# Patient Record
Sex: Female | Born: 1955 | Race: White | Hispanic: No | Smoking: Never smoker
Health system: Southern US, Community
[De-identification: ages and names within clinical notes are randomized; demographics above are authoritative.]

## PROBLEM LIST (undated history)

## (undated) DIAGNOSIS — R011 Cardiac murmur, unspecified: Secondary | ICD-10-CM

## (undated) DIAGNOSIS — G5600 Carpal tunnel syndrome, unspecified upper limb: Secondary | ICD-10-CM

## (undated) DIAGNOSIS — Z96649 Presence of unspecified artificial hip joint: Secondary | ICD-10-CM

## (undated) HISTORY — DX: Presence of unspecified artificial hip joint: Z96.649

## (undated) HISTORY — DX: Carpal tunnel syndrome, unspecified upper limb: G56.00

## (undated) HISTORY — DX: Cardiac murmur, unspecified: R01.1

## (undated) HISTORY — PX: BLADDER EXTROPHY RECONSTRUCTION PELVIC SAGITTAL OSTEOTOMY: SHX1236

---

## 2012-07-09 LAB — HM MAMMOGRAPHY: HM MAMMO: NORMAL

## 2012-08-10 LAB — HM COLONOSCOPY: HM Colonoscopy: NORMAL

## 2013-07-09 ENCOUNTER — Ambulatory Visit (INDEPENDENT_AMBULATORY_CARE_PROVIDER_SITE_OTHER): Payer: Medicare Other | Admitting: Family Medicine

## 2013-07-09 ENCOUNTER — Encounter: Payer: Self-pay | Admitting: Family Medicine

## 2013-07-09 VITALS — BP 110/80 | HR 82 | Temp 98.3°F | Resp 18 | Ht 63.5 in | Wt 215.0 lb

## 2013-07-09 DIAGNOSIS — J019 Acute sinusitis, unspecified: Secondary | ICD-10-CM

## 2013-07-09 DIAGNOSIS — R222 Localized swelling, mass and lump, trunk: Secondary | ICD-10-CM

## 2013-07-09 DIAGNOSIS — L720 Epidermal cyst: Secondary | ICD-10-CM | POA: Insufficient documentation

## 2013-07-09 DIAGNOSIS — H1013 Acute atopic conjunctivitis, bilateral: Secondary | ICD-10-CM

## 2013-07-09 DIAGNOSIS — L723 Sebaceous cyst: Secondary | ICD-10-CM

## 2013-07-09 DIAGNOSIS — H1010 Acute atopic conjunctivitis, unspecified eye: Secondary | ICD-10-CM | POA: Insufficient documentation

## 2013-07-09 DIAGNOSIS — H1045 Other chronic allergic conjunctivitis: Secondary | ICD-10-CM

## 2013-07-09 MED ORDER — SULFAMETHOXAZOLE-TMP DS 800-160 MG PO TABS: 1.0000 | ORAL_TABLET | Freq: Two times a day (BID) | ORAL | Status: DC

## 2013-07-09 NOTE — Patient Instructions (Addendum)
Ultrasound of chest to be done Eye drop- for allergies over the counter- [place] and LAMB Nasocort over the counter  Take antibiotics for the sinusitis F/U as needed

## 2013-07-09 NOTE — Assessment & Plan Note (Signed)
Treat based on worsening symptoms, no signs of cellulitis of right eye, bactrim given Will use Nasocort OTC  Unfortunately she has no prescription drug coverage

## 2013-07-09 NOTE — Progress Notes (Signed)
  Subjective:    Patient ID: Margaret Jimenez, female    DOB: [birth year], 57 y.o.   MRN: [id number]  HPI  Patient here with sinusitis and pressure around both eyes. She's had some drainage from the nose over the past week as well as a lot of pressure mostly on the right side of her face above and below her eye. She's not had any change in her vision. She has noticed some itching and redness of both eyes with some clear crusting in the morning the past couple of weeks. She's not had any fever has had some sore throat and mild cough associated.  She also complains of a couple of cysts that she will like me to evaluate as well as a mass on her chest wall which she noticed a couple weeks ago. She's had a normal mammogram in 2013.   Review of Systems   GEN- denies fatigue, fever, weight loss,weakness, recent illness HEENT- denies eye drainage, change in vision, +nasal discharge, CVS- denies chest pain, palpitations RESP- denies SOB, cough, wheeze Neuro- denies headache, dizziness, syncope, seizure activity      Objective:   Physical Exam GEN- NAD, alert and oriented x3 HEENT- PERRL, EOMI, non injected sclera, injected bulbar conjunctiva, MMM, oropharynx clear, TM clear bilat no effusion, + maxillary and right frontal sinus tenderness,+  Nasal drainage ,vision grossly in tact Breast- normal symmetry, no nipple inversion,no nipple drainage, no nodules or lumps felt Nodes- no axillary nodes Chest wall- 2-3 cm to right sternum- small nickle size mass palpated, mild TTP, no fluctance Neck- Supple, no LAD CVS- RRR, no murmur RESP-CTAB EXT- No edema Pulses- Radial 2+ Skin- lipoma Right lower back, epidermal cyst Left lower back, Upper back- central- small erythematous scab, no cystic lesion felt       Assessment & Plan:

## 2013-07-09 NOTE — Assessment & Plan Note (Signed)
I think her eyes are more allergic, will have her use OTC allergic eye drops

## 2013-07-09 NOTE — Assessment & Plan Note (Signed)
Ultrasound to be done, this is not in locaton of breast tissue

## 2013-07-10 NOTE — Addendum Note (Signed)
Addended by: Milinda Antis F on: 07/10/2013 01:39 PM   Modules accepted: Orders

## 2013-07-12 ENCOUNTER — Other Ambulatory Visit: Payer: Self-pay | Admitting: Family Medicine

## 2013-07-12 ENCOUNTER — Ambulatory Visit (HOSPITAL_COMMUNITY)
Admission: RE | Admit: 2013-07-12 | Discharge: 2013-07-12 | Disposition: A | Discharge status: Home / Self Care | Payer: Medicare Other | Source: Ambulatory Visit | Attending: Family Medicine | Admitting: Family Medicine

## 2013-07-12 DIAGNOSIS — R222 Localized swelling, mass and lump, trunk: Secondary | ICD-10-CM

## 2013-12-26 ENCOUNTER — Emergency Department (HOSPITAL_COMMUNITY)
Admission: EM | Admit: 2013-12-26 | Discharge: 2013-12-26 | Disposition: A | Discharge status: Home / Self Care | Payer: Medicare Other | Attending: Emergency Medicine | Admitting: Emergency Medicine

## 2013-12-26 ENCOUNTER — Encounter (HOSPITAL_COMMUNITY): Payer: Self-pay | Admitting: Emergency Medicine

## 2013-12-26 ENCOUNTER — Emergency Department (HOSPITAL_COMMUNITY): Payer: Medicare Other

## 2013-12-26 DIAGNOSIS — R011 Cardiac murmur, unspecified: Secondary | ICD-10-CM | POA: Insufficient documentation

## 2013-12-26 DIAGNOSIS — M25559 Pain in unspecified hip: Secondary | ICD-10-CM | POA: Insufficient documentation

## 2013-12-26 DIAGNOSIS — Z8669 Personal history of other diseases of the nervous system and sense organs: Secondary | ICD-10-CM | POA: Insufficient documentation

## 2013-12-26 DIAGNOSIS — Z880 Allergy status to penicillin: Secondary | ICD-10-CM | POA: Insufficient documentation

## 2013-12-26 DIAGNOSIS — M25551 Pain in right hip: Secondary | ICD-10-CM

## 2013-12-26 DIAGNOSIS — Z7982 Long term (current) use of aspirin: Secondary | ICD-10-CM | POA: Insufficient documentation

## 2013-12-26 DIAGNOSIS — Z96649 Presence of unspecified artificial hip joint: Secondary | ICD-10-CM | POA: Insufficient documentation

## 2013-12-26 DIAGNOSIS — Z9889 Other specified postprocedural states: Secondary | ICD-10-CM | POA: Insufficient documentation

## 2013-12-26 LAB — BASIC METABOLIC PANEL
BUN: 25 mg/dL — ABNORMAL HIGH (ref 6–23)
CO2: 25 meq/L (ref 19–32)
Calcium: 9.7 mg/dL (ref 8.4–10.5)
Chloride: 103 mEq/L (ref 96–112)
Creatinine, Ser: 0.63 mg/dL (ref 0.50–1.10)
GFR calc Af Amer: >90 mL/min (ref >90)
GFR calc non Af Amer: >90 mL/min (ref >90)
Glucose, Bld: 158 mg/dL — ABNORMAL HIGH (ref 70–99)
Potassium: 3.6 mEq/L — ABNORMAL LOW (ref 3.7–5.3)
SODIUM: 142 meq/L (ref 137–147)

## 2013-12-26 LAB — CBC
HCT: 37.3 % (ref 36.0–46.0)
HEMOGLOBIN: 12.5 g/dL (ref 12.0–15.0)
MCH: 31.7 pg (ref 26.0–34.0)
MCHC: 33.5 g/dL (ref 30.0–36.0)
MCV: 94.7 fL (ref 78.0–100.0)
Platelets: 237 10*3/uL (ref 150–400)
RBC: 3.94 MIL/uL (ref 3.87–5.11)
RDW: 12.5 % (ref 11.5–15.5)
WBC: 10.1 10*3/uL (ref 4.0–10.5)

## 2013-12-26 MED ORDER — OXYCODONE-ACETAMINOPHEN 5-325 MG PO TABS: 2.0000 | ORAL_TABLET | ORAL | Status: DC | PRN

## 2013-12-26 MED ORDER — ONDANSETRON HCL 4 MG/2ML IJ SOLN
4.0000 mg | Freq: Once | INTRAMUSCULAR | Status: AC
  Administered 2013-12-26: 4 mg via INTRAVENOUS
  Filled 2013-12-26: qty 2

## 2013-12-26 MED ORDER — HYDROMORPHONE HCL PF 1 MG/ML IJ SOLN
1.0000 mg | Freq: Once | INTRAMUSCULAR | Status: AC
  Administered 2013-12-26: 1 mg via INTRAVENOUS
  Filled 2013-12-26: qty 1

## 2013-12-26 NOTE — ED Notes (Addendum)
Patient transported to x-ray. ?

## 2013-12-26 NOTE — ED Notes (Signed)
Pt ambulated in the hallway with standby assistance.  Pt tolerated bearing weight to the right leg with pain noted, MD Dierdre Highmanpitz updated.

## 2013-12-26 NOTE — ED Notes (Signed)
Waiting for results of second CT scan, Dr. Criss AlvineGoldston to assess. Patient updated with plan of care.

## 2013-12-26 NOTE — Discharge Instructions (Signed)
Arthralgia  Your caregiver has diagnosed you as suffering from an arthralgia. Arthralgia means there is pain in a joint. This can come from many reasons including:  · Bruising the joint which causes soreness (inflammation) in the joint.  · Wear and tear on the joints which occur as we grow older (osteoarthritis).  · Overusing the joint.  · Various forms of arthritis.  · Infections of the joint.  Regardless of the cause of pain in your joint, most of these different pains respond to anti-inflammatory drugs and rest. The exception to this is when a joint is infected, and these cases are treated with antibiotics, if it is a bacterial infection.  HOME CARE INSTRUCTIONS   · Rest the injured area for as long as directed by your caregiver. Then slowly start using the joint as directed by your caregiver and as the pain allows. Crutches as directed may be useful if the ankles, knees or hips are involved. If the knee was splinted or casted, continue use and care as directed. If an stretchy or elastic wrapping bandage has been applied today, it should be removed and re-applied every 3 to 4 hours. It should not be applied tightly, but firmly enough to keep swelling down. Watch toes and feet for swelling, bluish discoloration, coldness, numbness or excessive pain. If any of these problems (symptoms) occur, remove the ace bandage and re-apply more loosely. If these symptoms persist, contact your caregiver or return to this location.  · For the first 24 hours, keep the injured extremity elevated on pillows while lying down.  · Apply ice for 15-20 minutes to the sore joint every couple hours while awake for the first half day. Then 03-04 times per day for the first 48 hours. Put the ice in a plastic bag and place a towel between the bag of ice and your skin.  · Wear any splinting, casting, elastic bandage applications, or slings as instructed.  · Only take over-the-counter or prescription medicines for pain, discomfort, or fever as  directed by your caregiver. Do not use aspirin immediately after the injury unless instructed by your physician. Aspirin can cause increased bleeding and bruising of the tissues.  · If you were given crutches, continue to use them as instructed and do not resume weight bearing on the sore joint until instructed.  Persistent pain and inability to use the sore joint as directed for more than 2 to 3 days are warning signs indicating that you should see a caregiver for a follow-up visit as soon as possible. Initially, a hairline fracture (break in bone) may not be evident on X-rays. Persistent pain and swelling indicate that further evaluation, non-weight bearing or use of the joint (use of crutches or slings as instructed), or further X-rays are indicated. X-rays may sometimes not show a small fracture until a week or 10 days later. Make a follow-up appointment with your own caregiver or one to whom we have referred you. A radiologist (specialist in reading X-rays) may read your X-rays. Make sure you know how you are to obtain your X-ray results. Do not assume everything is normal if you do not hear from us.  SEEK MEDICAL CARE IF:  Bruising, swelling, or pain increases.  SEEK IMMEDIATE MEDICAL CARE IF:   · Your fingers or toes are numb or blue.  · The pain is not responding to medications and continues to stay the same or get worse.  · The pain in your joint becomes severe.  · You   develop a fever over 102° F (38.9° C).  · It becomes impossible to move or use the joint.  MAKE SURE YOU:   · Understand these instructions.  · Will watch your condition.  · Will get help right away if you are not doing well or get worse.  Document Released: 08/08/2005 Document Revised: 10/31/2011 Document Reviewed: 03/26/2008  [employer]® Patient Information ©2014 [employer].

## 2013-12-26 NOTE — ED Notes (Signed)
Per EMS: pt has had a hip injury and bent over this evening and felt a pain in her hip. Pt was ambulatory out the door for EMS but couldn't get down her stairs. Pt calling out in pain currently.

## 2013-12-26 NOTE — ED Notes (Signed)
Pt placed on 2L Nasal cannula, O2 saturation around 89%, now at 95%.

## 2013-12-26 NOTE — ED Provider Notes (Signed)
CSN: [id number]     Arrival date & time 12/26/13  0319 History   First MD Initiated Contact with Patient 12/26/13 0327     Chief Complaint  Patient presents with  . Hip Pain     (Consider location/radiation/quality/duration/timing/severity/associated sxs/prior Treatment) HPI History per patient. Right hip [place] 2005 in [place]. Patient uses a walker occasionally. She denies any significant issues with her hip otherwise. Today woke up, moved her leg and developed severe right hip pain. No fall or trauma. Able to ambulate on it. Pain is sharp and severe radiates to pelvis and the right thigh. No associated weakness or numbness. No fevers or chills. No history of same. No known alleviating factors. Past Medical History  Diagnosis Date  . Heart murmur     artificial heart  . Presence of artificial hip     right side  . Carpal tunnel syndrome    Past Surgical History  Procedure Laterality Date  . Bladder extrophy reconstruction pelvic sagittal osteotomy     Family History  Problem Relation Age of Onset  . Cancer Father     lymphoma   History  Substance Use Topics  . Smoking status: Not on file  . Smokeless tobacco: Not on file  . Alcohol Use: Not on file   OB History   Grav Para Term Preterm Abortions TAB SAB Ect Mult Living                 Review of Systems  Constitutional: Negative for fever and chills.  Respiratory: Negative for shortness of breath.   Cardiovascular: Negative for chest pain.  Gastrointestinal: Negative for vomiting and abdominal pain.  Genitourinary: Negative for flank pain.  Musculoskeletal: Negative for neck pain.  Skin: Negative for rash.  Neurological: Negative for weakness and numbness.  All other systems reviewed and are negative.     Allergies  Celexa and Penicillins  Home Medications   Prior to Admission medications   Medication Sig Start Date End Date Taking? Authorizing Provider  aspirin EC 325 MG tablet Take 325 mg by  mouth every 6 (six) hours as needed for moderate pain.   Yes Historical Provider, MD   BP 131/57  Pulse 63  Temp(Src) 98.5 F (36.9 C) (Oral)  Resp 18  SpO2 100% Physical Exam  Constitutional: She is oriented to person, place, and time. She appears well-developed and well-nourished.  HENT:  Head: Normocephalic and atraumatic.  Eyes: EOM are normal. Pupils are equal, round, and reactive to light.  Neck: Neck supple.  Cardiovascular: Normal rate, regular rhythm and intact distal pulses.   Pulmonary/Chest: Effort normal and breath sounds normal. No respiratory distress.  Abdominal: Soft. Bowel sounds are normal. She exhibits no distension. There is no tenderness.  Musculoskeletal: She exhibits no edema.  Pelvis stable. Localizes discomfort to right hip without reproducible tenderness. Patient moving in the bed and moving her right lower extremity. There is no deformity or shortening. Distal neurovascular intact. No tenderness of the lumbar spine, thoracic spine. No areas of swelling or erythema appreciated.  Neurological: She is alert and oriented to person, place, and time.  Skin: Skin is warm and dry. No rash noted.    ED Course  Procedures (including critical care time) Labs Review Labs Reviewed  BASIC METABOLIC PANEL - Abnormal; Notable for the following:    Potassium 3.6 (*)    Glucose, Bld 158 (*)    BUN 25 (*)    All other components within normal limits  CBC  Imaging Review Dg Hip Complete Right  12/26/2013   CLINICAL DATA:  Progressively worsening right hip pain. No known injury.  EXAM: RIGHT HIP - COMPLETE 2+ VIEW  COMPARISON:  None.  FINDINGS: Large bony defect in the right acetabulum which could be postsurgical, post infectious, or posttraumatic. There is a total right hip arthroplasty with plate and screw fixation to the right iliac wing and rib obturator ring. The femoral prosthesis is long-stem. No periprosthetic fracture is seen. There are fractured cerclage wires  and additional metallic debris around the prosthetic hip joint. Lucency noted around both the iliac and obturator portions of the acetabular component. Negative left hip.  IMPRESSION: Total right hip arthroplasty as above. 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   Electronically Signed   By: Tiburcio PeaJonathan  Watts M.D.   On: 12/26/2013 04:46   IV Dilaudid provided.  X-ray reviewed and patient still having some pain. Discussed with radiologist who recommends CT scan or MRI to evaluate for possible occult fracture. DR Criss AlvineGoldston to follow up CT results, if neg will d/c home Rx percocet and Ortho referral.   MDM   Diagnosis: Right hip pain  Prior hip replacement. No trauma with pain onset this am. No F/C or clinical septic joint, she is able to move it. Pain improved with IV Dilaudid and able to bear weight. CT ordered to further evaluate.    Sunnie NielsenBrian Stasha Naraine, MD 12/26/13 [phone]

## 2014-01-27 ENCOUNTER — Encounter: Payer: Self-pay | Admitting: Family Medicine

## 2015-01-29 ENCOUNTER — Encounter: Payer: Self-pay | Admitting: Family Medicine

## 2015-02-25 LAB — HM MAMMOGRAPHY: HM Mammogram: NORMAL

## 2015-02-26 ENCOUNTER — Encounter: Payer: Self-pay | Admitting: Family Medicine

## 2015-05-12 ENCOUNTER — Encounter: Payer: Self-pay | Admitting: Family Medicine

## 2016-03-23 ENCOUNTER — Encounter: Payer: Self-pay | Admitting: Family Medicine

## 2016-10-14 LAB — HM MAMMOGRAPHY

## 2016-10-24 ENCOUNTER — Encounter: Payer: Self-pay | Admitting: Family Medicine

## 2018-03-23 ENCOUNTER — Other Ambulatory Visit: Payer: Self-pay | Admitting: Orthopedic Surgery

## 2018-03-23 DIAGNOSIS — M545 Low back pain: Secondary | ICD-10-CM

## 2018-03-25 ENCOUNTER — Other Ambulatory Visit: Payer: Medicare Other

## 2018-03-28 ENCOUNTER — Ambulatory Visit
Admission: RE | Admit: 2018-03-28 | Discharge: 2018-03-28 | Disposition: A | Discharge status: Home / Self Care | Payer: Medicare HMO | Source: Ambulatory Visit | Attending: Orthopedic Surgery | Admitting: Orthopedic Surgery

## 2018-03-28 DIAGNOSIS — M545 Low back pain: Secondary | ICD-10-CM

## 2019-01-11 ENCOUNTER — Other Ambulatory Visit: Payer: Self-pay | Admitting: Family Medicine

## 2019-01-11 DIAGNOSIS — R222 Localized swelling, mass and lump, trunk: Secondary | ICD-10-CM

## 2019-01-15 ENCOUNTER — Ambulatory Visit
Admission: RE | Admit: 2019-01-15 | Discharge: 2019-01-15 | Disposition: A | Discharge status: Home / Self Care | Payer: Medicare HMO | Source: Ambulatory Visit | Attending: Family Medicine | Admitting: Family Medicine

## 2019-01-15 ENCOUNTER — Other Ambulatory Visit: Payer: Self-pay

## 2019-01-15 DIAGNOSIS — R222 Localized swelling, mass and lump, trunk: Secondary | ICD-10-CM

## 2020-10-18 IMAGING — US US SOFT TISSUE EXCLUDE HEAD/NECK
1 series · 12 of 12 positions shown · non-contrast
Comparison: None.

CLINICAL DATA: Left chest mass.  Rule out lipoma or sebaceous cyst.

EXAM:
ULTRASOUND OF CHEST SOFT TISSUES
TECHNIQUE: Ultrasound examination of the chest wall soft tissues was performed
in the area of clinical concern.

[Series 1: us soft tissue exclude head/neck · 0.06mm/px · 12 of 12 slices shown]
[im 1/12]
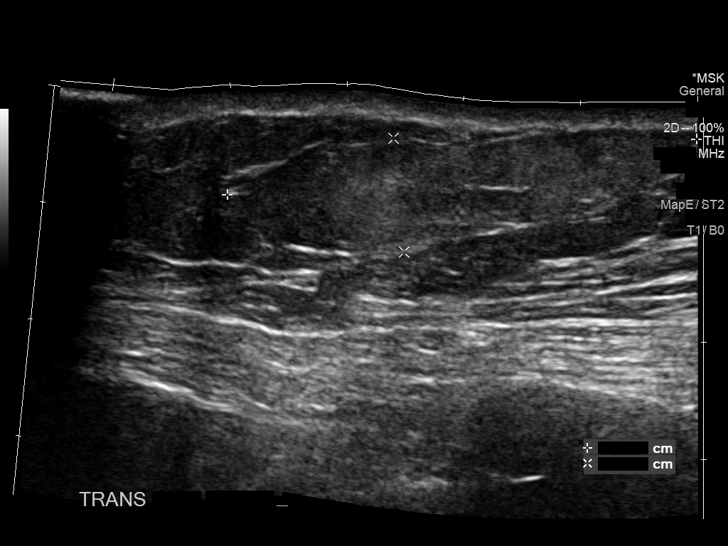
[im 2/12]
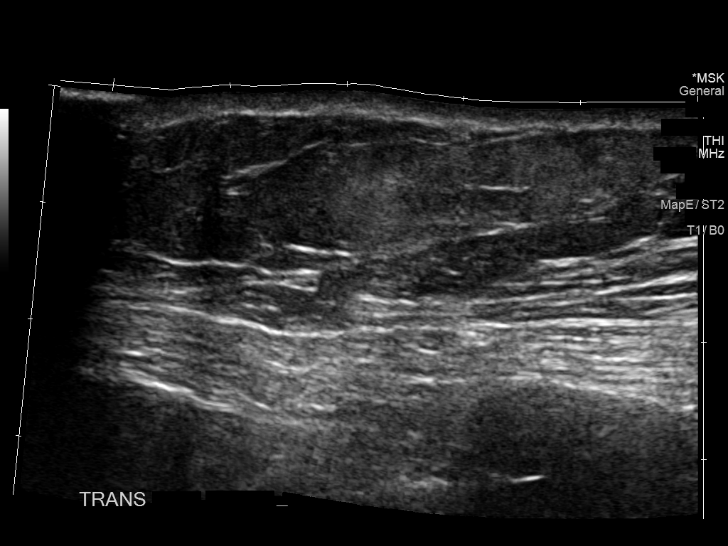
[im 3/12]
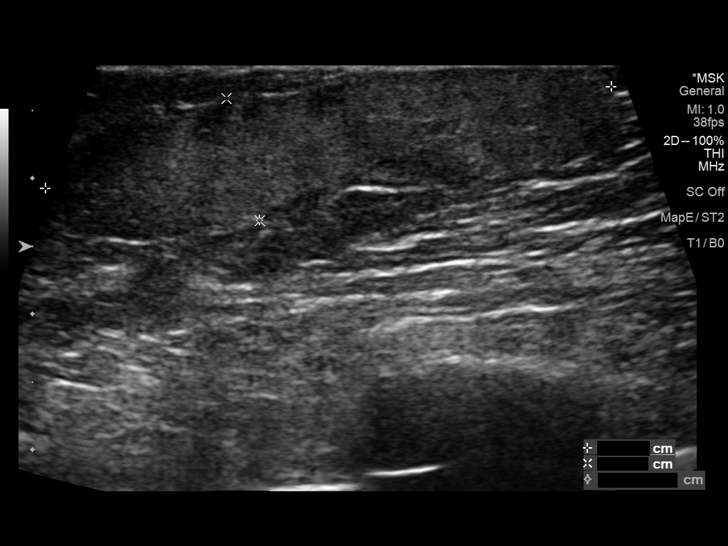
[im 4/12]
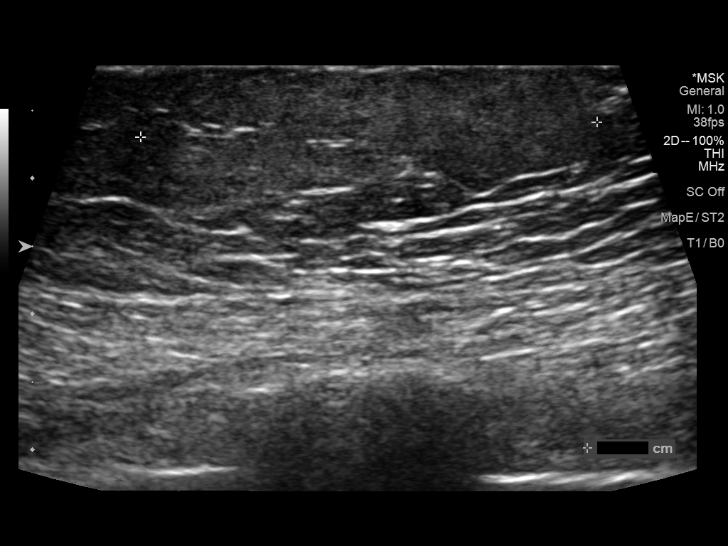
[im 5/12]
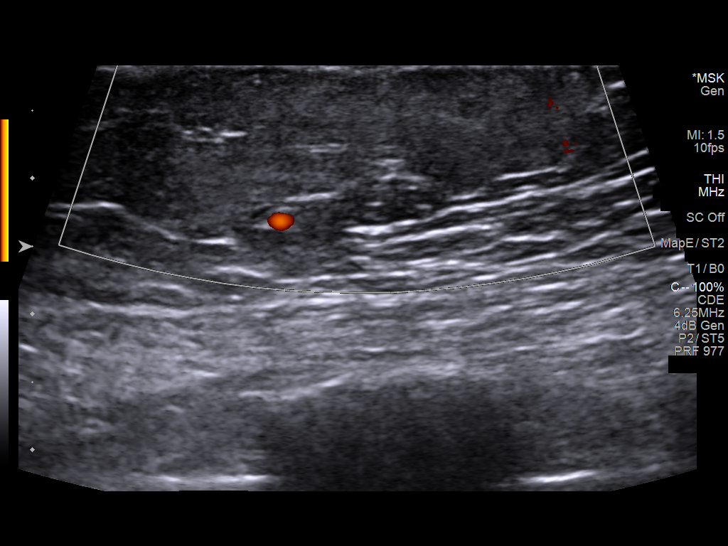
[im 6/12]
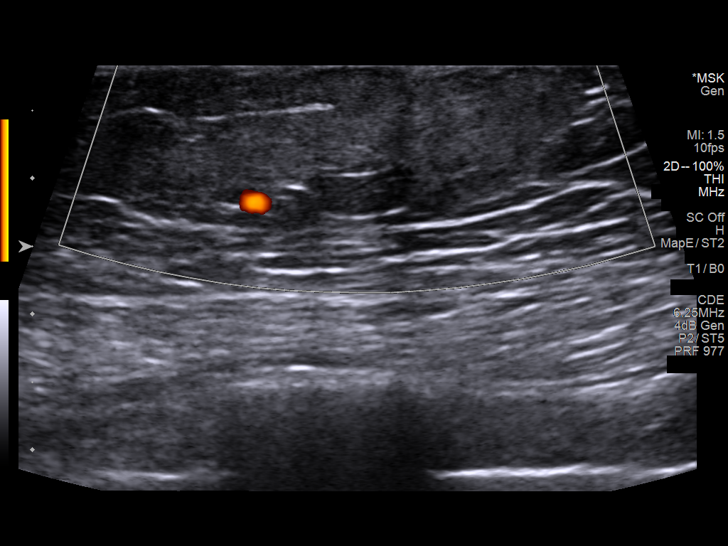
[im 7/12]
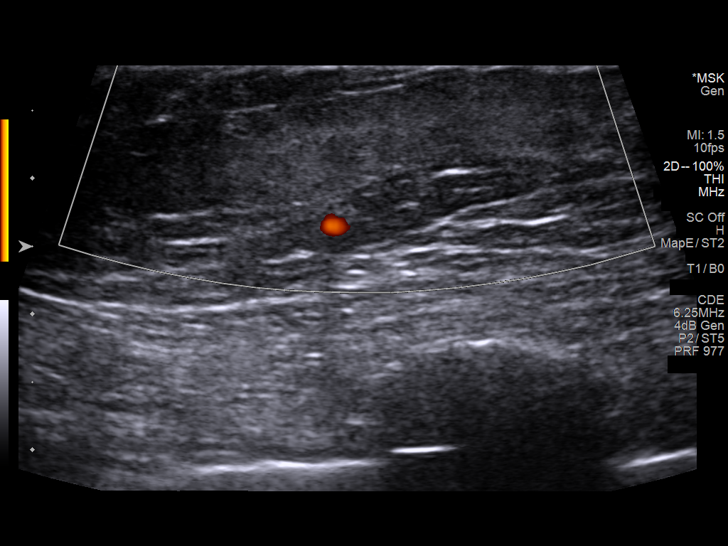
[im 8/12]
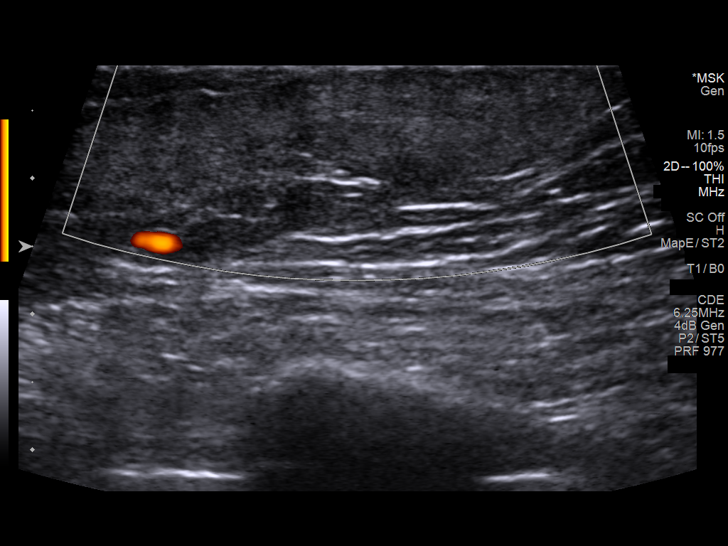
[im 9/12]
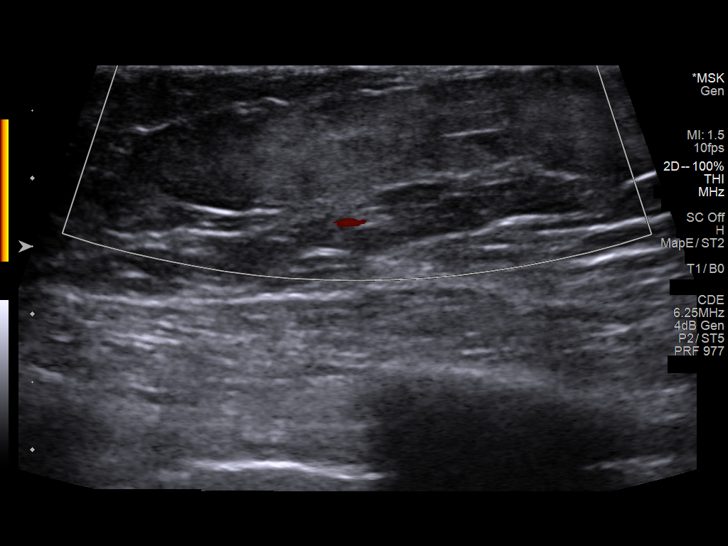
[im 10/12]
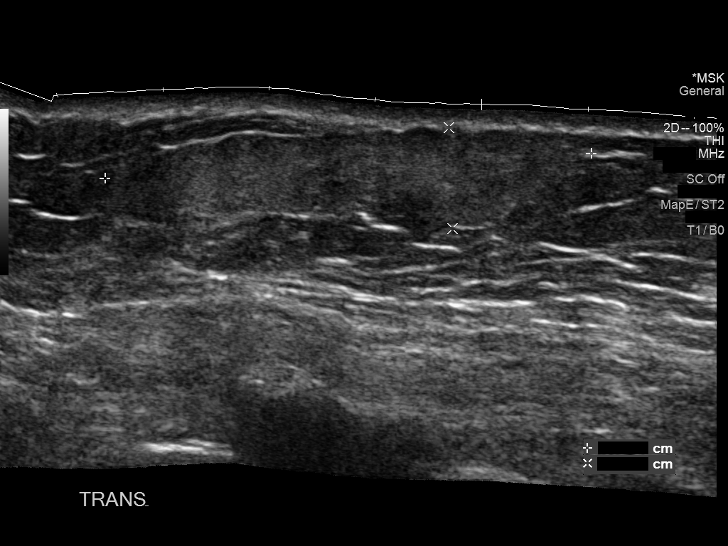
[im 11/12]
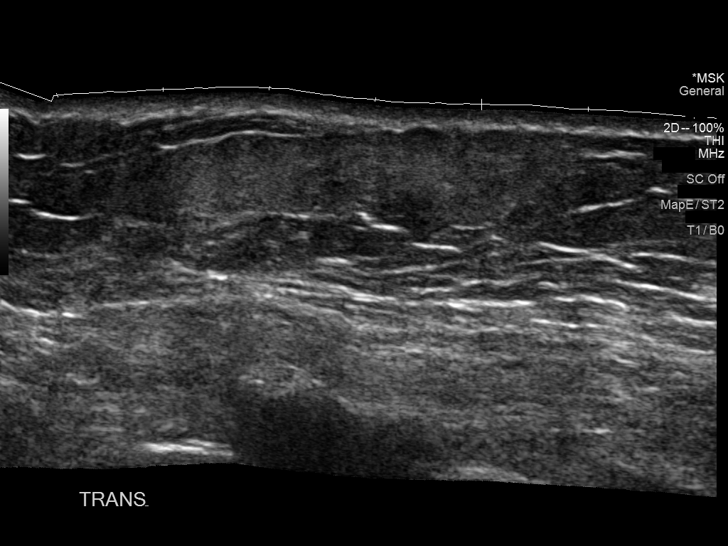
[im 12/12]
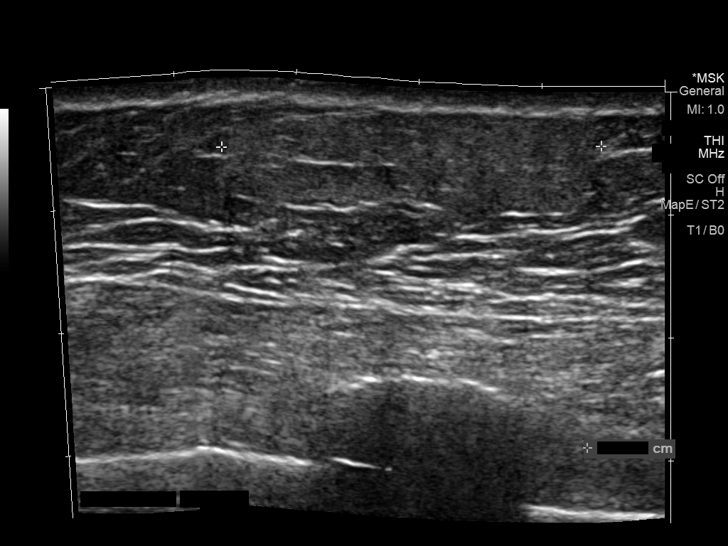

[12 of 12 positions shown; findings below may reference images not displayed]

FINDINGS: Within the subcutaneous soft tissues, there is a solid isoechoic to
slightly hyperechoic mass which measures 4.6 x 3.1 x 1.0 cm.
Appearance is most compatible with lipoma. No other soft tissue
abnormality noted.
IMPRESSION: Isoechoic to slightly hyperechoic solid mass in the subcutaneous
soft tissues of the left chest wall most compatible with lipoma.

## 2022-09-05 ENCOUNTER — Other Ambulatory Visit: Payer: Self-pay | Admitting: *Deleted

## 2022-09-05 DIAGNOSIS — I739 Peripheral vascular disease, unspecified: Secondary | ICD-10-CM

## 2022-09-05 NOTE — Progress Notes (Unsigned)
[facility] [place]  ASSESSMENT / PLAN: Margaret Jimenez is a 67 y.o. female with atherosclerosis of *** native arteries of *** causing {Chronic PAD levels:25303}.  Patient counseled {pad risk2:26283}  WIfI score calculated based on clinical exam and non-invasive measurements. {WIFIvascular:26096}  Recommend the following which can slow the progression of atherosclerosis and reduce the risk of major adverse cardiac / limb events:  Complete cessation from all tobacco products. Blood glucose control with goal A1c < 7%. Blood pressure control with goal blood pressure < 140/90 mmHg. Lipid reduction therapy with goal LDL-C <100 mg/dL (<70 if symptomatic from PAD).  Aspirin 81mg  PO QD.  *** Clopidogrel 75mg  PO QD. *** Rivaroxaban 2.5mg  PO BID. *** Cilostozal 100mg  PO BID for intermittent claudication without evidence of heart failure. Atorvastatin 40-80mg  PO QD (or other "high intensity" statin therapy). *** Daily walking to and past the point of discomfort. Patient counseled to keep a log of exercise distance. *** Adequate hydration (at least 2 liters / day) if patient's heart and kidney function is adequate.  Plan *** lower extremity angiogram with possible intervention via *** approach in cath lab ***.    CHIEF COMPLAINT: ***  HISTORY OF PRESENT ILLNESS: Margaret Jimenez is a 67 y.o. female ***  VASCULAR SURGICAL HISTORY: ***  VASCULAR RISK FACTORS: {FINDINGS; POSITIVE NEGATIVE:[id number]} history of stroke / transient ischemic attack. {FINDINGS; POSITIVE NEGATIVE:[id number]} history of coronary artery disease. *** history of PCI. *** history of CABG.  {FINDINGS; POSITIVE NEGATIVE:[id number]} history of diabetes mellitus. Last A1c ***. {FINDINGS; POSITIVE NEGATIVE:[id number]} history of smoking. *** actively smoking. {FINDINGS; POSITIVE NEGATIVE:[id number]} history of hypertension. *** drug regimen with *** control. {FINDINGS; POSITIVE  NEGATIVE:[phone]} history of chronic kidney disease.  Last GFR ***. CKD {stage:30421363}. {FINDINGS; POSITIVE NEGATIVE:[phone]} history of chronic obstructive pulmonary disease, treated with ***.  FUNCTIONAL STATUS: ECOG performance status: {findings; ecog performance status:31780} Ambulatory status: {TNHAmbulation:25868}  CAREY 1 AND 3 YEAR INDEX Female (2pts) 75-79 or 80-84 (2pts) >84 (3pts) Dependence in toileting (1pt) Partial or full dependence in dressing (1pt) History of malignant neoplasm (2pts) CHF (3pts) COPD (1pts) CKD (3pts)  0-3 pts 6% 1 year mortality ; 21% 3 year mortality 4-5 pts 12% 1 year mortality ; 36% 3 year mortality >5 pts 21% 1 year mortality; 54% 3 year mortality   Past Medical History:  Diagnosis Date   Carpal tunnel syndrome    Heart murmur    artificial heart   Presence of artificial hip    right side    Past Surgical History:  Procedure Laterality Date   BLADDER EXTROPHY RECONSTRUCTION PELVIC SAGITTAL OSTEOTOMY      Family History  Problem Relation Age of Onset   Cancer Father        lymphoma    Social History   Socioeconomic History   Marital status: Widowed    Spouse name: Not on file   Number of children: Not on file   Years of education: Not on file   Highest education level: Not on file  Occupational History   Not on file  Tobacco Use   Smoking status: Not on file   Smokeless tobacco: Not on file  Substance and Sexual Activity   Alcohol use: Not on file   Drug use: Not on file   Sexual activity: Not on file  Other Topics Concern   Not on file  Social History Narrative   Not on file   Social Determinants of Health   Financial Resource Strain: Not on file  Food Insecurity: Not on file  Transportation Needs: Not on file  Physical Activity: Not on file  Stress: Not on file  Social Connections: Not on file  Intimate Partner Violence: Not on file    Allergies  Allergen Reactions   Celexa [Citalopram] Other  (See Comments)    "almost died"   Penicillins Other (See Comments)    Sores in mouth    Current Outpatient Medications  Medication Sig Dispense Refill   aspirin EC 325 MG tablet Take 325 mg by mouth every 6 (six) hours as needed for moderate pain.     oxyCODONE-acetaminophen (PERCOCET/ROXICET) 5-325 MG per tablet Take 2 tablets by mouth every 4 (four) hours as needed for severe pain. 15 tablet 0   No current facility-administered medications for this visit.    PHYSICAL EXAM There were no vitals filed for this visit.  Constitutional: *** appearing. *** distress. Appears *** nourished.  Neurologic: CN ***. *** focal findings. *** sensory loss. Psychiatric: *** Mood and affect symmetric and appropriate. Eyes: *** No icterus. No conjunctival pallor. Ears, nose, throat: *** mucous membranes moist. Midline trachea.  Cardiac: *** rate and rhythm.  Respiratory: *** unlabored. Abdominal: *** soft, non-tender, non-distended.  Peripheral vascular: *** Extremity: *** edema. *** cyanosis. *** pallor.  Skin: *** gangrene. *** ulceration.  Lymphatic: *** Stemmer's sign. *** palpable lymphadenopathy.    PERTINENT LABORATORY AND RADIOLOGIC DATA  Most recent CBC    Latest Ref Rng & Units 12/26/2013    3:50 AM  CBC  WBC 4.0 - 10.5 K/uL 10.1   Hemoglobin 12.0 - 15.0 g/dL 12.5   Hematocrit 36.0 - 46.0 % 37.3   Platelets 150 - 400 K/uL 237      Most recent CMP    Latest Ref Rng & Units 12/26/2013    3:50 AM  CMP  Glucose 70 - 99 mg/dL 158   BUN 6 - 23 mg/dL 25   Creatinine 0.50 - 1.10 mg/dL 0.63   Sodium 137 - 147 mEq/L 142   Potassium 3.7 - 5.3 mEq/L 3.6   Chloride 96 - 112 mEq/L 103   CO2 19 - 32 mEq/L 25   Calcium 8.4 - 10.5 mg/dL 9.7     Renal function CrCl cannot be calculated (Patient's most recent lab result is older than the maximum 21 days allowed.).  No results found for: "HGBA1C"  No results found for: "[place]", "LDLC", "HIRISKLDL", "POCLDL", "LDLDIRECT",  "REALLDLC", "TOTLDLC"   Vascular Imaging: ***  Danyel Griess N. Stanford Breed, MD FACS [facility] Phone Number: [phone] 09/05/2022 9:08 PM   Total time spent on preparing this encounter including chart review, data review, collecting history, examining the patient, coordinating care for this {tnhtimebilling:26202}  Portions of this report may have been transcribed using voice recognition software.  Every effort has been made to ensure accuracy; however, inadvertent computerized transcription errors may still be present.

## 2022-09-06 ENCOUNTER — Ambulatory Visit (INDEPENDENT_AMBULATORY_CARE_PROVIDER_SITE_OTHER): Payer: Medicare Other | Admitting: Vascular Surgery

## 2022-09-06 ENCOUNTER — Encounter: Payer: Self-pay | Admitting: Vascular Surgery

## 2022-09-06 ENCOUNTER — Ambulatory Visit (HOSPITAL_COMMUNITY)
Admission: RE | Admit: 2022-09-06 | Discharge: 2022-09-06 | Disposition: A | Discharge status: Home / Self Care | Payer: Medicare Other | Source: Ambulatory Visit | Attending: Vascular Surgery | Admitting: Vascular Surgery

## 2022-09-06 VITALS — BP 131/84 | HR 51 | Temp 98.3°F | Resp 20 | Ht 63.5 in | Wt 209.6 lb

## 2022-09-06 DIAGNOSIS — I739 Peripheral vascular disease, unspecified: Secondary | ICD-10-CM

## 2022-09-06 DIAGNOSIS — R6889 Other general symptoms and signs: Secondary | ICD-10-CM

## 2022-09-06 LAB — VAS US ABI WITH/WO TBI
Left ABI: 1.08
Right ABI: 1.11

## 2022-09-08 ENCOUNTER — Other Ambulatory Visit: Payer: Self-pay

## 2022-09-08 DIAGNOSIS — I739 Peripheral vascular disease, unspecified: Secondary | ICD-10-CM

## 2022-12-05 ENCOUNTER — Encounter: Payer: Self-pay | Admitting: Surgery

## 2022-12-05 ENCOUNTER — Ambulatory Visit (HOSPITAL_COMMUNITY)
Admission: RE | Admit: 2022-12-05 | Discharge: 2022-12-05 | Disposition: A | Discharge status: Home / Self Care | Payer: Medicare Other | Source: Ambulatory Visit | Attending: Surgery | Admitting: Surgery

## 2022-12-05 ENCOUNTER — Ambulatory Visit: Payer: Medicare Other | Admitting: Surgery

## 2022-12-05 VITALS — BP 114/70 | HR 52 | Temp 97.9°F | Resp 20 | Ht 63.5 in | Wt 197.0 lb

## 2022-12-05 DIAGNOSIS — I739 Peripheral vascular disease, unspecified: Secondary | ICD-10-CM | POA: Diagnosis present

## 2022-12-05 DIAGNOSIS — I872 Venous insufficiency (chronic) (peripheral): Secondary | ICD-10-CM | POA: Diagnosis not present

## 2022-12-05 NOTE — Progress Notes (Signed)
[facility]  Patient name: Margaret Jimenez MRN: [id number] DOB: 01-Aug-1956 Sex: female   REASON FOR VISIT:    Follow up  HISOTRY OF PRESENT ILLNESS:    Margaret Jimenez is a 67 y.o. female returns today for follow-up of her varicose veins.  These have been present for a long time.  She states that she got leg swelling in her left leg after a long car ride 1 year ago.  Her left leg is chronically larger than her right.  She will occasionally wear knee-high compression socks which do help.  She states that she gets a little bit of swelling in her legs.  She also has varicosities on her right leg which are unsightly.     PAST MEDICAL HISTORY:   Past Medical History:  Diagnosis Date   Carpal tunnel syndrome    Heart murmur    artificial heart   Presence of artificial hip    right side     FAMILY HISTORY:   Family History  Problem Relation Age of Onset   Cancer Father        lymphoma    SOCIAL HISTORY:   Social History   Tobacco Use   Smoking status: Never   Smokeless tobacco: Never  Substance Use Topics   Alcohol use: Not Currently     ALLERGIES:   Allergies  Allergen Reactions   Celexa [Citalopram] Other (See Comments)    "almost died"   Penicillins Other (See Comments)    Sores in mouth     CURRENT MEDICATIONS:   No current outpatient medications on file.   No current facility-administered medications for this visit.    REVIEW OF SYSTEMS:    denotes positive finding,  denotes negative finding Cardiac  Comments:  Chest pain or chest pressure:    Shortness of breath upon exertion:    Short of breath when lying flat:    Irregular heart rhythm:        Vascular    Pain in calf, thigh, or hip brought on by ambulation:    Pain in feet at night that wakes you up from your sleep:     Blood clot in your veins:    Leg swelling:         Pulmonary    Oxygen at  home:    Productive cough:     Wheezing:         Neurologic    Sudden weakness in arms or legs:     Sudden numbness in arms or legs:     Sudden onset of difficulty speaking or slurred speech:    Temporary loss of vision in one eye:     Problems with dizziness:         Gastrointestinal    Blood in stool:     Vomited blood:         Genitourinary    Burning when urinating:     Blood in urine:        Psychiatric    Major depression:         Hematologic    Bleeding problems:    Problems with blood clotting too easily:        Skin    Rashes or ulcers:        Constitutional    Fever or chills:      PHYSICAL EXAM:   Vitals:   12/05/22 1143  BP: 114/70  Pulse: (!) 52  Resp: 20  Temp: 97.9 F (36.6 C)  SpO2: 96%  Weight: 197 lb (89.4 kg)  Height: 5' 3.5" (1.613 m)    GENERAL: The patient is a well-nourished female, in no acute distress. The vital signs are documented above. CARDIAC: There is a regular rate and rhythm.  VASCULAR: Trace edema bilaterally.  Palpable pedal pulses.  Right calf posterior varicosities PULMONARY: Non-labored respirations ABDOMEN: Soft and non-tender with normal pitched bowel sounds.  MUSCULOSKELETAL: There are no major deformities or cyanosis. NEUROLOGIC: No focal weakness or paresthesias are detected. SKIN: There are no ulcers or rashes noted. PSYCHIATRIC: The patient has a normal affect.  STUDIES:   I have reviewed her venous reflux study with the following findings: Right:  - No evidence of deep vein thrombosis seen in the right lower extremity,  from the common femoral through the popliteal veins.  - No evidence of superficial venous thrombosis in the right lower  extremity.    - The deep venous system is incompetent at the common femoral vein.  - The great saphenous vein is incompetent.  - The small saphenous vein is grossly incompetent.  MEDICAL ISSUES:   Chronic venous insufficiency: The patient could not tolerate  ultrasound evaluation of the left leg.  She did undergo evaluation of the right.  She has small saphenous reflux as well as deep system reflux.  She has prominent varicosity on the right posterior calf.  I proposed that we could consider endovenous laser ablation of the right small saphenous vein with stab phlebectomy to address this, however she is not interested in having anything done that does not have to be done.  Therefore, I have recommended that she just continue wearing her compression socks and follow-up as needed    Durene Cal, IV, MD, FACS [facility] [phone] Pager [phone]
